# Patient Record
Sex: Male | Born: 1970 | Race: Black or African American | Hispanic: No | Marital: Married | Smoking: Former smoker
Health system: Southern US, Community
[De-identification: ages and names within clinical notes are randomized; demographics above are authoritative.]

## PROBLEM LIST (undated history)

## (undated) HISTORY — PX: HERNIA REPAIR: SHX51

---

## 2003-03-02 ENCOUNTER — Ambulatory Visit (HOSPITAL_BASED_OUTPATIENT_CLINIC_OR_DEPARTMENT_OTHER): Admission: RE | Admit: 2003-03-02 | Discharge: 2003-03-02 | Payer: Self-pay | Admitting: Orthopedic Surgery

## 2005-05-12 ENCOUNTER — Emergency Department (HOSPITAL_COMMUNITY): Admission: EM | Admit: 2005-05-12 | Discharge: 2005-05-12 | Payer: Self-pay | Admitting: Family Medicine

## 2008-05-27 ENCOUNTER — Ambulatory Visit (HOSPITAL_COMMUNITY): Admission: RE | Admit: 2008-05-27 | Discharge: 2008-05-27 | Payer: Self-pay | Admitting: Surgery

## 2009-07-18 IMAGING — CR DG CHEST 2V
3 series · 3 of 3 positions shown · non-contrast
Comparison: None

CLINICAL DATA: preadmission radiograph.

CHEST - 2 VIEW

[view not recorded (1 of 3)]
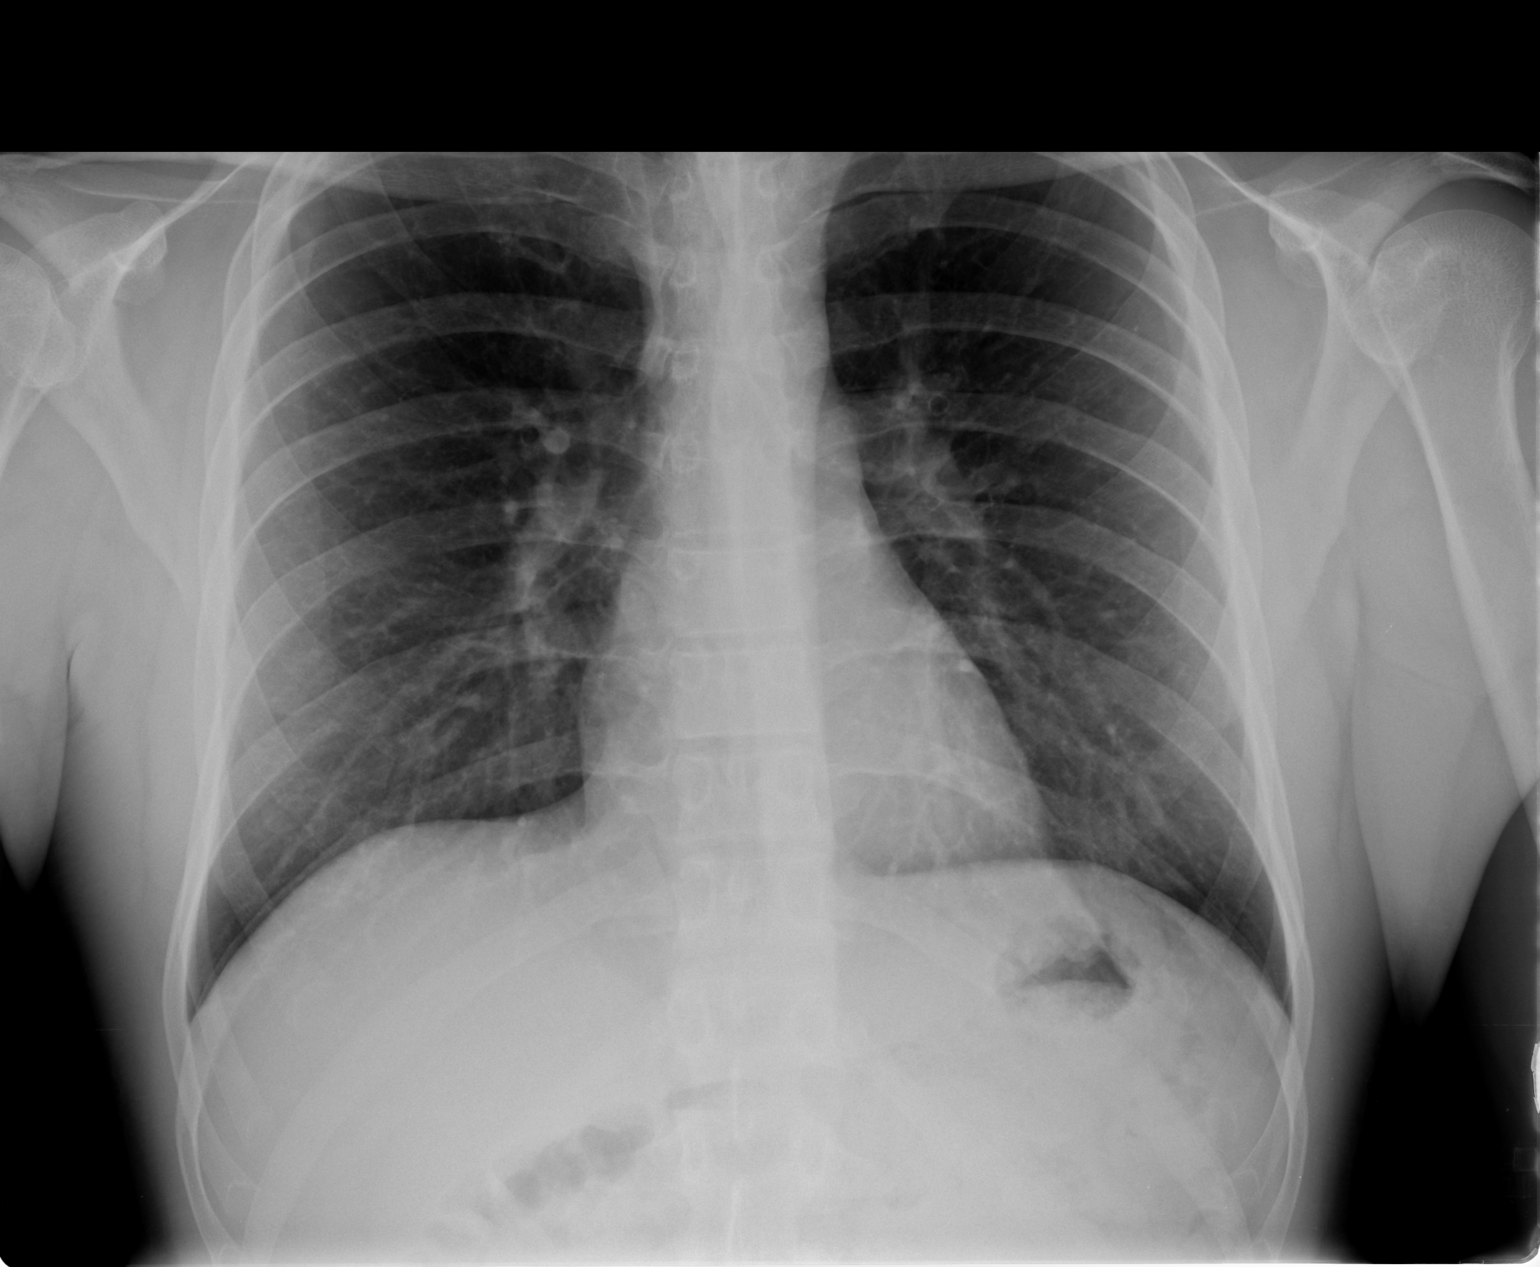

[view not recorded (2 of 3)]
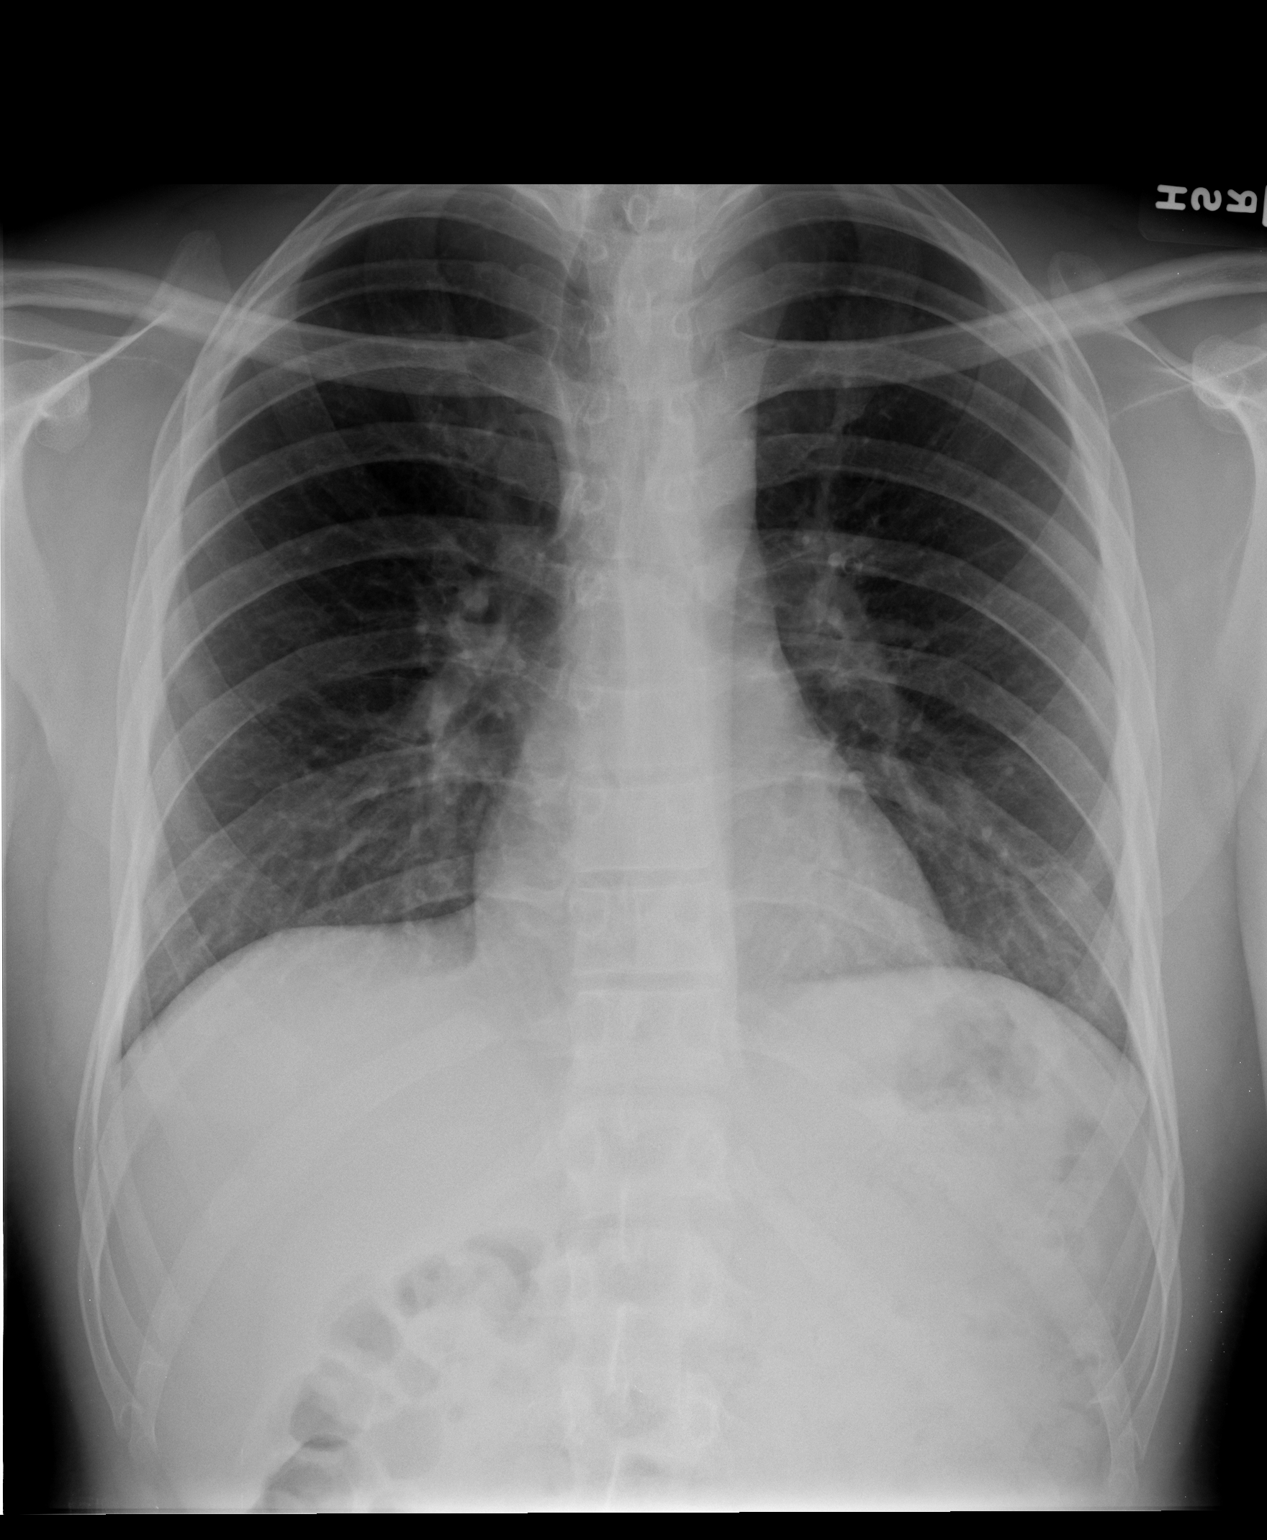

[view not recorded (3 of 3)]
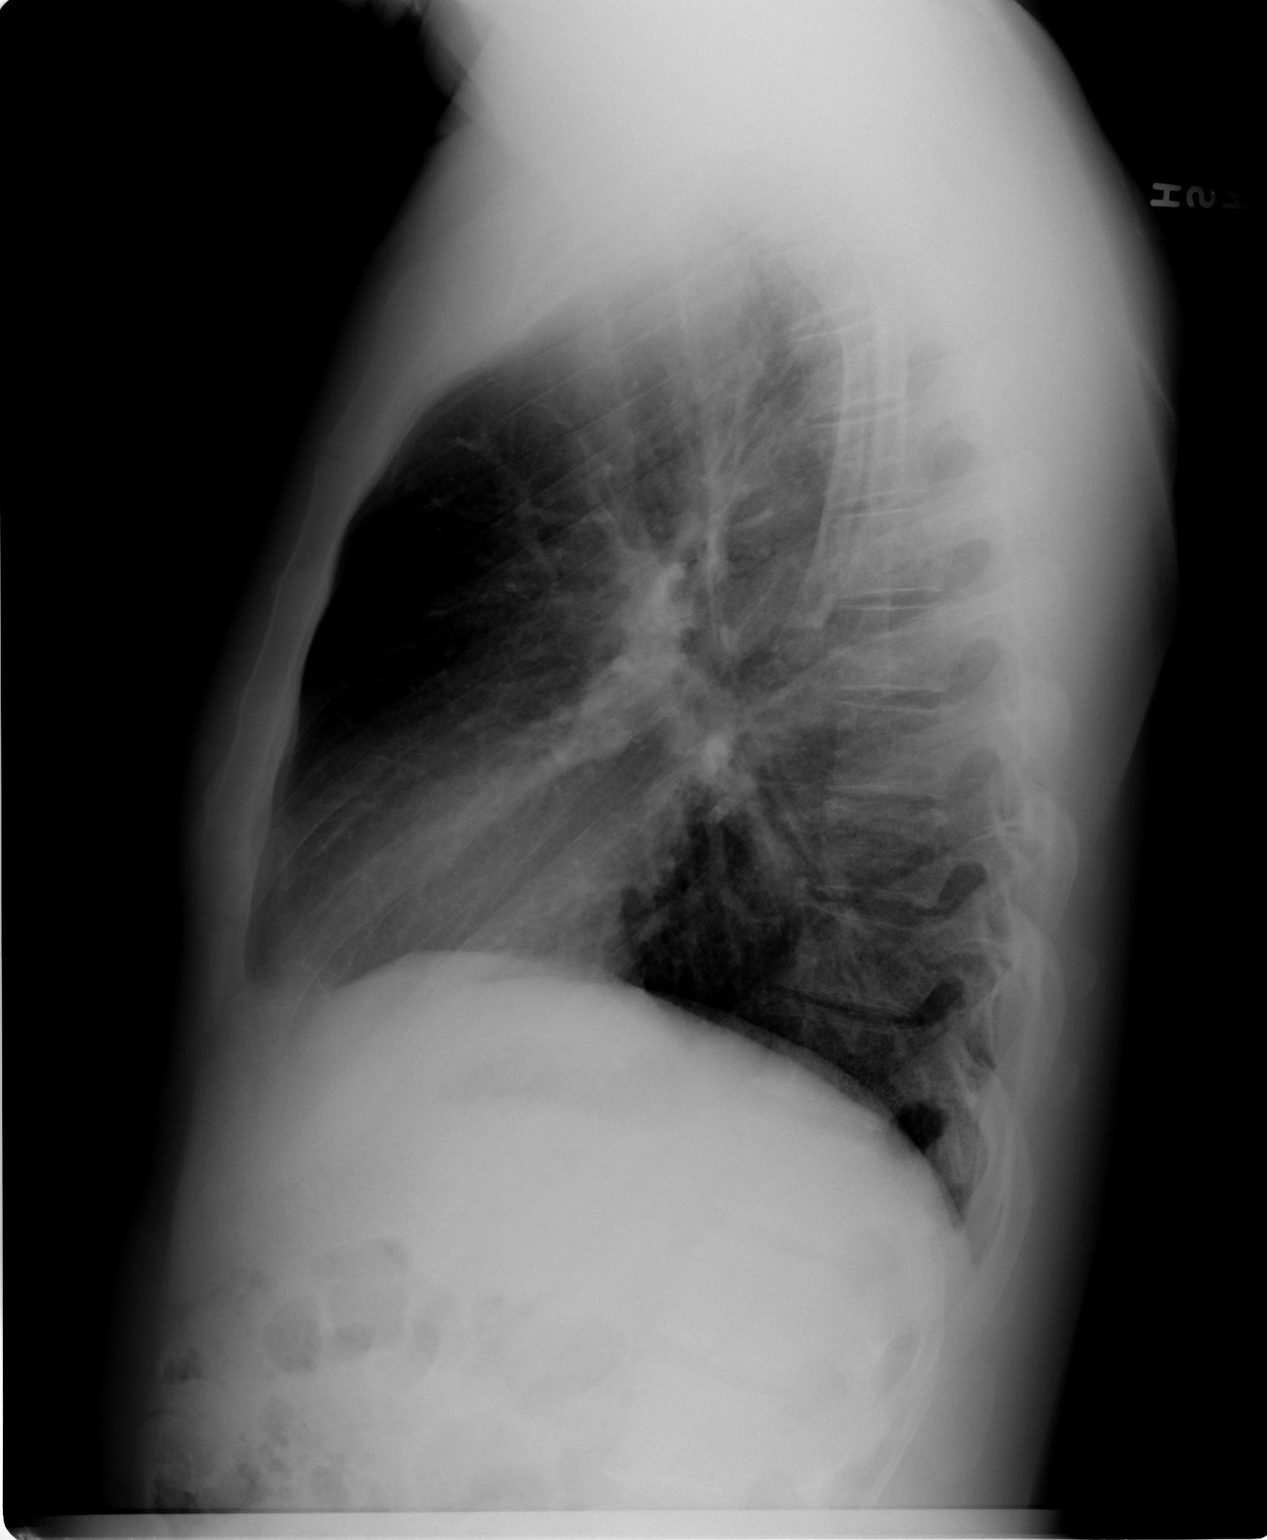

[3 of 3 positions shown; findings below may reference images not displayed]

FINDINGS: Heart size is normal.

There is no pleural effusion or pulmonary edema

No airspace densities identified.
IMPRESSION: 1.  No active cardiopulmonary disease.

## 2010-08-02 LAB — CBC
Platelets: 236 10*3/uL (ref 150–400)
WBC: 3.9 10*3/uL — ABNORMAL LOW (ref 4.0–10.5)

## 2010-08-02 LAB — DIFFERENTIAL
Lymphocytes Relative: 33 % (ref 12–46)
Lymphs Abs: 1.3 10*3/uL (ref 0.7–4.0)
Neutrophils Relative %: 58 % (ref 43–77)

## 2010-08-30 NOTE — Op Note (Signed)
Devin Woodard, Devin Woodard              ACCOUNT NO.:  [id number]   MEDICAL RECORD NO.:  [id number]          PATIENT TYPE:  AMB   LOCATION:  SDS                          FACILITY:  [facility]   PHYSICIAN:  [name], M.D.DATE OF BIRTH:  June 10, 1970   DATE OF PROCEDURE:  05/27/2008  DATE OF DISCHARGE:                               OPERATIVE REPORT   PREOPERATIVE DIAGNOSIS:  Right inguinal hernia.   POSTOPERATIVE DIAGNOSIS:  Right inguinal hernia.   PROCEDURE:  Repair of right inguinal hernia with mesh.   SURGEON:  Maisie Fus A. Cornett, MD   ANESTHESIA:  LMA with 0.25% Sensorcaine local with epinephrine.   ESTIMATED BLOOD LOSS:  20 mL.   SPECIMEN:  None.   DRAINS:  None.   INDICATIONS FOR PROCEDURE:  The patient is a 40 year old male with a  right inguinal hernia.  It is causing symptoms and he wishes to have it  repaired.   DESCRIPTION OF PROCEDURE:  The patient was brought to the operating room  and placed supine.  After induction of LMA anesthesia, the right  inguinal region was prepped and draped in sterile fashion.  Oblique  incision was made in the right inguinal crease.  Dissection was carried  down to subcu tissues.  We infiltrated 0.25% Sensorcaine local  anesthesia.  After ligating the superficial epigastric vessels, we  dissected down  to the aponeurosis of the external oblique muscle.  This  was injected with 0.25% Sensorcaine and it was open in the direction of  its fibers.  We then used my finger to sweep around the cord structures,  encircled them with a 0.5-inch Penrose drain.  There was an indirect sac  and dissected this off the cord structures along with a large lipoma.  The ilioinguinal nerve was divided since it was caught up in the hernia  sac.  After doing this, I reduced all the hernia contents in left lower  back in the preperitoneal space.  We then used a large Prolene Hernia  system placing the air leaflet in the preperitoneal space.  This was  spread  out.  Once this was spread out with my finger, I then laid the  onlay piece of mesh on the floor of the inguinal  canal.  The slit was  cut for cord structures.  Cord was brought out through the cord  structures.  We then secured the mesh to the pubic tubercle with a 0  Vicryl and 0 Vicryl was also used to connect  the tore  piece of the  mesh to the internal oblique.  A #1 Novofils were used to secure the  mesh to the shelving edge of inguinal ligament and the conjoint tendon.  The slit was closed around mesh leaving enough room  to the tip of my  fifth digit. After this was one, the mesh lay quite nicely and is flat.  The iliohypogastric nerve was preserved.  Local anesthesia was  infiltrated under the external oblique.  We then closed the external  oblique with a running 2-0 Vicryl.  A 3-0 Vicryl was used to approximate  Scarpa fascia  and 4-0 Monocryl was used to close the skin in  subcuticular fashion.  Dermabond was applied as dressing.  All final  counts of sponge, needle instruments were found to be correct in this  portion of the case.  The patient was awoken and taken to the recovery  in satisfactory condition.      [name], M.D.  Electronically Signed     TAC/MEDQ  D:  05/27/2008  T:  05/28/2008  Job:  [id number]   cc:   Deirdre Peer. Polite, M.D.

## 2010-09-02 NOTE — Op Note (Signed)
NAME:  Devin Woodard, Devin Woodard                        ACCOUNT NO.:  [id number]   MEDICAL RECORD NO.:  [id number]                   PATIENT TYPE:  AMB   LOCATION:  DSC                                  FACILITY:  [facility]   PHYSICIAN:  Harvie Junior, M.D.                DATE OF BIRTH:  Aug 24, 1970   DATE OF PROCEDURE:  03/02/2003  DATE OF DISCHARGE:                                 OPERATIVE REPORT   PREOPERATIVE DIAGNOSIS:  Right Achilles tendon rupture.   POSTOPERATIVE DIAGNOSIS:  Right Achilles tendon rupture.   PROCEDURE:  Repair of right Achilles tendon.   SURGEON:  Harvie Junior, M.D.   ASSISTANT:  Marshia Ly, P.A.   ANESTHESIA:  General.   INDICATIONS FOR PROCEDURE:  The patient is a 40 year old male with a long  history of playing basketball. Having made a move he felt a pop in the back  of his leg. He turned around and no one was there. He ultimately had  difficulty  moving the foot. He was seen by Dr. Althea Charon at [facility] who evaluated him with an Achilles tendon  rupture and we  were consulted at that point. We talked about treatment  options including operative versus nonoperative treatment and ultimately he  elected to undergo operative intervention. He was taken to the operating  room for Achilles tendon repair.   DESCRIPTION OF PROCEDURE:  The patient was taken to the operating room and  after an adequate level of  anesthesia was obtained with general  endotracheal anesthesia, the patient was placed prone on the operating  table. The right leg was then prepped and draped in the usual sterile  fashion. Following Esmarch exsanguination of the extremity the blood  pressure tourniquet was inflated to 300 mmHg.   Following  this an incision was made just  lateral  to the Achilles tendon.  Subcutaneous dissection was taken down to the level of the paratenon. The  paratenon was identified and opened. The Achilles tendon both proximally  and  distally were both identified. A #2 fiberwire suture  was used in a  running locking Krackow type stitch in both the proximal  and distal piece  and then these were tied. Excellent stability was achieved and the Achilles  could be brought down into neutral station. The tension was found to be  excellent with the repair.   At this point the wound was copiously irrigated. Initially a 4-0 nylon which  was used for covering the sutures with strands of tendon. Once this was  undertaken attention was turned the paratenon closure which was done with a  running 3-0 Vicryl suture. Excellent closure of the paratenon was  undertaken. The skin was then closed with 3-0 Vicryl and a running 4-0 nylon  suture. A sterile compressive dressing was applied at this point with the  foot in plantar flexion at 30 degrees.  The patient was then taken to the recovery room. He was noted to be in  satisfactory condition. Estimated blood loss for the procedure at this point  was none.                                               Harvie Junior, M.D.    Ranae Plumber  D:  03/02/2003  T:  03/02/2003  Job:  [id number]

## 2013-05-23 ENCOUNTER — Emergency Department (INDEPENDENT_AMBULATORY_CARE_PROVIDER_SITE_OTHER): Payer: Self-pay

## 2013-05-23 ENCOUNTER — Emergency Department (INDEPENDENT_AMBULATORY_CARE_PROVIDER_SITE_OTHER)
Admission: EM | Admit: 2013-05-23 | Discharge: 2013-05-23 | Disposition: A | Discharge status: Home / Self Care | Payer: Self-pay | Source: Home / Self Care | Attending: Family Medicine | Admitting: Family Medicine

## 2013-05-23 ENCOUNTER — Encounter (HOSPITAL_COMMUNITY): Payer: Self-pay | Admitting: Emergency Medicine

## 2013-05-23 DIAGNOSIS — M25519 Pain in unspecified shoulder: Secondary | ICD-10-CM

## 2013-05-23 DIAGNOSIS — M25511 Pain in right shoulder: Secondary | ICD-10-CM

## 2013-05-23 MED ORDER — MELOXICAM 7.5 MG PO TABS: 7.5000 mg | ORAL_TABLET | Freq: Every day | ORAL | Status: DC

## 2013-05-23 NOTE — ED Notes (Signed)
Pt  Reports  Symptoms  Of       r  Shoulder  Pain  Which  She  He  Reports  He  Has  Had  For  Sen  Months      He  denys  Any recent injury           rom is   Present  But  painfull

## 2013-05-23 NOTE — Discharge Instructions (Signed)
Arthralgia °Your caregiver has diagnosed you as suffering from an arthralgia. Arthralgia means there is pain in a joint. This can come from many reasons including: °· Bruising the joint which causes soreness (inflammation) in the joint. °· Wear and tear on the joints which occur as we grow older (osteoarthritis). °· Overusing the joint. °· Various forms of arthritis. °· Infections of the joint. °Regardless of the cause of pain in your joint, most of these different pains respond to anti-inflammatory drugs and rest. The exception to this is when a joint is infected, and these cases are treated with antibiotics, if it is a bacterial infection. °HOME CARE INSTRUCTIONS  °· Rest the injured area for as long as directed by your caregiver. Then slowly start using the joint as directed by your caregiver and as the pain allows. Crutches as directed may be useful if the ankles, knees or hips are involved. If the knee was splinted or casted, continue use and care as directed. If an stretchy or elastic wrapping bandage has been applied today, it should be removed and re-applied every 3 to 4 hours. It should not be applied tightly, but firmly enough to keep swelling down. Watch toes and feet for swelling, bluish discoloration, coldness, numbness or excessive pain. If any of these problems (symptoms) occur, remove the ace bandage and re-apply more loosely. If these symptoms persist, contact your caregiver or return to this location. °· For the first 24 hours, keep the injured extremity elevated on pillows while lying down. °· Apply ice for 15-20 minutes to the sore joint every couple hours while awake for the first half day. Then 03-04 times per day for the first 48 hours. Put the ice in a plastic bag and place a towel between the bag of ice and your skin. °· Wear any splinting, casting, elastic bandage applications, or slings as instructed. °· Only take over-the-counter or prescription medicines for pain, discomfort, or fever as  directed by your caregiver. Do not use aspirin immediately after the injury unless instructed by your physician. Aspirin can cause increased bleeding and bruising of the tissues. °· If you were given crutches, continue to use them as instructed and do not resume weight bearing on the sore joint until instructed. °Persistent pain and inability to use the sore joint as directed for more than 2 to 3 days are warning signs indicating that you should see a caregiver for a follow-up visit as soon as possible. Initially, a hairline fracture (break in bone) may not be evident on X-rays. Persistent pain and swelling indicate that further evaluation, non-weight bearing or use of the joint (use of crutches or slings as instructed), or further X-rays are indicated. X-rays may sometimes not show a small fracture until a week or 10 days later. Make a follow-up appointment with your own caregiver or one to whom we have referred you. A radiologist (specialist in reading X-rays) may read your X-rays. Make sure you know how you are to obtain your X-ray results. Do not assume everything is normal if you do not hear from us. °SEEK MEDICAL CARE IF: °Bruising, swelling, or pain increases. °SEEK IMMEDIATE MEDICAL CARE IF:  °· Your fingers or toes are numb or blue. °· The pain is not responding to medications and continues to stay the same or get worse. °· The pain in your joint becomes severe. °· You develop a fever over 102° F (38.9° C). °· It becomes impossible to move or use the joint. °MAKE SURE YOU:  °·   Understand these instructions.  Will watch your condition.  Will get help right away if you are not doing well or get worse. Document Released: 04/03/2005 Document Revised: 06/26/2011 Document Reviewed: 11/20/2007 [facility] Patient Information 2014 [place], [place]. Adhesive Capsulitis Sometimes the shoulder becomes stiff and is painful to move. Some people say it feels as if the shoulder is frozen in place. Because of this, the  condition is called "frozen shoulder." Its medical name is adhesive capsulitis.  The shoulder joint is made up of strong connective tissue that attaches the ball of the humerus to the shallow shoulder socket. This strong connective tissue is called the joint capsule. This tissue can become stiff and swollen. That is when adhesive capsulitis sets in. CAUSES  It is not always clear just what the cause adhesive capsulitis. Possibilities include:  Injury to the shoulder joint.  Strain. This is a repetitive injury brought about by overuse.  Lack of use. Perhaps your arm or hand was otherwise injured. It might have been in a sling for awhile. Or perhaps you were not using it to avoid pain.  Referred pain. This is a sort of trick the body plays. You feel pain in the shoulder. But, the pain actually comes from an injury somewhere else in the body.  Long-standing health problems. Several diseases can cause adhesive capsulitis. They include diabetes, heart disease, stroke, thyroid problems, rheumatoid arthritis and lung disease.  Being a women older than 40. Anyone can develop adhesive capsulitis but it is most common in women in this age group. SYMPTOMS   Pain.  It occurs when the arm is moved.  Parts of the shoulder might hurt if they are touched.  Pain is worse at night or when resting.  Soreness. It might not be strong enough to be called pain. But, the shoulder aches.  The shoulder does not move freely.  Muscle spasms.  Trouble sleeping because of shoulder ache or pain. DIAGNOSIS  To decide if you have adhesive capsulitis, your healthcare provider will probably:  Ask about symptoms you have noticed.  Ask about your history of joint pain and anything that might have caused the pain.  Ask about your overall health.  Use hands to feel your shoulder and neck.  Ask you to move your shoulder in specific directions. This may indicate the origin of the pain.  Order imaging tests;  pictures of the shoulder. They help pinpoint the source of the problem. An X-ray might be used. For more detail, an MRI is often used. An MRI details the tendons, muscles and ligaments as well as the joint. TREATMENT  Adhesive capsulitis can be treated several ways. Most treatments can be done in a clinic or in your healthcare provider's office. Be sure to discuss the different options with your caregiver. They include:  Physical therapy. You will work on specific exercises to get your shoulder moving again. The exercises usually involve stretching. A physical therapist (a caregiver with special training) can show you what to do and what not to do. The exercises will need to be done daily.  Medication.  Over-the-counter medicines may relieve pain and inflammation (the body's way of reacting to injury or infection).  Corticosteroids. These are stronger drugs to reduce pain and inflammation. They are given by injection (shots) into the shoulder joint. Frequent treatment is not recommended.  Muscle relaxants. Medication may be prescribed to ease muscle spasms.  Treatment of underlying conditions. This means treating another condition that is causing your shoulder problem. This  might be a rotator cuff (tendon) problem  Shoulder manipulation. The shoulder will be moved by your healthcare provider. You would be under general anesthesia (given a drug that puts you to sleep). You would not feel anything. Sometimes the joint will be injected with salt water (saline) at high pressure to break down internal scarring in the joint capsule.  Surgery. This is rarely needed. It may be suggested in advanced cases after all other treatment has failed. PROGNOSIS  In time, most people recover from adhesive capsulitis. Sometimes, however, the pain goes away but full movement of the shoulder does not return.  HOME CARE INSTRUCTIONS   Take any pain medications recommended by your healthcare provider. Follow the  directions carefully.  If you have physical therapy, follow through with the therapist's suggestions. Be sure you understand the exercises you will be doing. You should understand:  How often the exercises should be done.  How many times each exercise should be repeated.  How long they should be done.  What other activities you should do, or not do.  That you should warm up before doing any exercise. Just 5 to 10 minutes will help. Small, gentle movements should get your shoulder ready for more.  Avoid high-demand exercise that involves your shoulder such as throwing. This type of exercise can make pain worse.  Consider using cold packs. Cold may ease swelling and pain. Ask your healthcare provider if a cold pack might help you. If so, get directions on how and when to use them. SEEK MEDICAL CARE IF:   You have any questions about your medications.  Your pain continues to increase. Document Released: 01/29/2009 Document Revised: 06/26/2011 Document Reviewed: 01/29/2009 [facility] Patient Information 2014 [place], [place].

## 2013-05-23 NOTE — ED Provider Notes (Signed)
CSN: [id number]     Arrival date & time 05/23/13  0913 History   First MD Initiated Contact with Patient 05/23/13 1037     Chief Complaint  Patient presents with  . Shoulder Pain   (Consider location/radiation/quality/duration/timing/severity/associated sxs/prior Treatment) Patient is a 43 y.o. male presenting with shoulder pain. The history is provided by the patient. No language interpreter was used.  Shoulder Pain This is a new problem. The problem occurs constantly. The problem has been rapidly worsening. Pertinent negatives include no shortness of breath. Nothing aggravates the symptoms. Nothing relieves the symptoms. He has tried nothing for the symptoms. The treatment provided no relief.  Pt complains of pain in his shoulder sine a fall last June.    History reviewed. No pertinent past medical history. History reviewed. No pertinent past surgical history. History reviewed. No pertinent family history. History  Substance Use Topics  . Smoking status: Not on file  . Smokeless tobacco: Not on file  . Alcohol Use: No    Review of Systems  Respiratory: Negative for shortness of breath.   Musculoskeletal: Positive for myalgias. Negative for joint swelling.  All other systems reviewed and are negative.    Allergies  Review of patient's allergies indicates no known allergies.  Home Medications  No current outpatient prescriptions on file. BP 136/80  Pulse 74  Temp(Src) 98.3 F (36.8 C) (Oral)  Resp 16  SpO2 100% Physical Exam  Nursing note and vitals reviewed. Constitutional: He appears well-developed and well-nourished.  HENT:  Head: Normocephalic and atraumatic.  Right Ear: External ear normal.  Left Ear: External ear normal.  Eyes: Conjunctivae and EOM are normal. Pupils are equal, round, and reactive to light.  Neck: Normal range of motion.  Cardiovascular: Normal rate and normal heart sounds.   Pulmonary/Chest: Effort normal.  Musculoskeletal: He exhibits  tenderness.  Limited range of motion,  nv and ns intact  Neurological: He is alert.  Skin: Skin is warm.  Psychiatric: He has a normal mood and affect.    ED Course  Procedures (including critical care time) Labs Review Labs Reviewed - No data to display Imaging Review Dg Shoulder Right  05/23/2013   CLINICAL DATA:  Recent traumatic injury with pain  EXAM: RIGHT SHOULDER - 2+ VIEW  COMPARISON:  None.  FINDINGS: There is no evidence of fracture or dislocation. There is no evidence of arthropathy or other focal bone abnormality. Soft tissues are unremarkable.  IMPRESSION: No acute abnormality noted.   Electronically Signed   By: Alcide CleverMark  Lukens M.D.   On: 05/23/2013 11:35      MDM   1. Right shoulder pain    Pt referred to Dr. Charlann Boxerlin for evaluation.   Rx for meloxicam   Elson AreasLeslie K Azad Calame, PA-C 05/23/13 1244

## 2013-05-28 NOTE — ED Provider Notes (Signed)
Medical screening examination/treatment/procedure(s) were performed by resident physician or non-physician practitioner and as supervising physician I was immediately available for consultation/collaboration.   Owen Pagnotta DOUGLAS MD.   Que Meneely D Garlen Reinig, MD 05/28/13 1803 

## 2014-07-09 ENCOUNTER — Encounter: Payer: Self-pay | Admitting: *Deleted

## 2014-07-09 ENCOUNTER — Emergency Department
Admission: EM | Admit: 2014-07-09 | Discharge: 2014-07-09 | Disposition: A | Discharge status: Home / Self Care | Payer: Self-pay | Source: Home / Self Care | Attending: Emergency Medicine | Admitting: Emergency Medicine

## 2014-07-09 DIAGNOSIS — R05 Cough: Secondary | ICD-10-CM

## 2014-07-09 DIAGNOSIS — N480 Leukoplakia of penis: Secondary | ICD-10-CM

## 2014-07-09 DIAGNOSIS — J029 Acute pharyngitis, unspecified: Secondary | ICD-10-CM

## 2014-07-09 DIAGNOSIS — R059 Cough, unspecified: Secondary | ICD-10-CM

## 2014-07-09 DIAGNOSIS — N489 Disorder of penis, unspecified: Secondary | ICD-10-CM

## 2014-07-09 LAB — POCT RAPID STREP A (OFFICE): RAPID STREP A SCREEN: NEGATIVE

## 2014-07-09 MED ORDER — ACYCLOVIR 200 MG PO CAPS: 200.0000 mg | ORAL_CAPSULE | Freq: Every day | ORAL | Status: AC

## 2014-07-09 NOTE — ED Provider Notes (Signed)
CSN: [id number]     Arrival date & time 07/09/14  1439 History   First MD Initiated Contact with Patient 07/09/14 1511     Chief Complaint  Patient presents with  . Penile lesion   . Sore Throat   (Consider location/radiation/quality/duration/timing/severity/associated sxs/prior Treatment) Patient is a 44 y.o. male presenting with pharyngitis. The history is provided by the patient.  Sore Throat This is a new problem. The current episode started 2 days ago. The problem occurs constantly. The problem has been gradually worsening. Pertinent negatives include no chest pain. Nothing aggravates the symptoms. Nothing relieves the symptoms. He has tried nothing for the symptoms.  Pt also complains of a lesion on the shaft of his penis.  Pt noticed after oral sex.  Pt also concerned about diabetes, cholesterol and wants a check up and blood work.  Pt reports possible std exposure.  History reviewed. No pertinent past medical history. Past Surgical History  Procedure Laterality Date  . Hernia repair     History reviewed. No pertinent family history. History  Substance Use Topics  . Smoking status: Former Games developermoker  . Smokeless tobacco: Never Used  . Alcohol Use: No    Review of Systems  Cardiovascular: Negative for chest pain.  Genitourinary: Positive for penile pain. Negative for urgency, discharge, penile swelling and testicular pain.  Skin: Positive for wound.  All other systems reviewed and are negative.   Allergies  Review of patient's allergies indicates no known allergies.  Home Medications   Prior to Admission medications   Medication Sig Start Date End Date Taking? Authorizing Provider  acyclovir (ZOVIRAX) 200 MG capsule Take 1 capsule (200 mg total) by mouth 5 (five) times daily. 07/09/14   Elson AreasLeslie K Rylei Codispoti, PA-C   BP 125/78 mmHg  Pulse 66  Temp(Src) 98.1 F (36.7 C) (Oral)  Resp 14  Ht 5\' 8"  (1.727 m)  Wt 188 lb (85.276 kg)  BMI 28.59 kg/m2  SpO2 99% Physical Exam   Constitutional: He appears well-developed.  HENT:  Head: Normocephalic and atraumatic.  Eyes: Pupils are equal, round, and reactive to light.  Neck: Normal range of motion.  Cardiovascular: Normal rate and normal heart sounds.   Pulmonary/Chest: Effort normal.  Abdominal: Soft.  Genitourinary:  Small abrasion looking area dorsal shaft of peins,  (looks like a small abrasion,  2 small (tiny area's that could be pimple/blister)   Musculoskeletal: Normal range of motion.  Neurological: He is alert.  Skin: Skin is warm.  Psychiatric: He has a normal mood and affect.  Nursing note and vitals reviewed.   ED Course  Procedures (including critical care time) Labs Review Labs Reviewed  HERPES SIMPLEX VIRUS CULTURE  POCT RAPID STREP A (OFFICE)    Imaging Review No results found.   MDM   1. Penile lesion   2. Acute pharyngitis, unspecified pharyngitis type   3. Cough    I will send herpes culture Pt given rx for acyclovir  Pt advised symptomatic care for throat. Pt given primary care referral for check up    Elson AreasLeslie K Anona Giovannini, PA-C 07/09/14 1737

## 2014-07-09 NOTE — ED Notes (Signed)
Pt c/o sore throat x 2 days. Also c/o penile bumps x 1 week that has turned into a lesion. Denies discharge or pain. He inquired about routine blood work as he does not have a PCP. Advised to talk to provider as we are not a PCP office. Encouraged him to establish with a PCP.

## 2014-07-09 NOTE — Discharge Instructions (Signed)
Pharyngitis Pharyngitis is redness, pain, and swelling (inflammation) of your pharynx.  CAUSES  Pharyngitis is usually caused by infection. Most of the time, these infections are from viruses (viral) and are part of a cold. However, sometimes pharyngitis is caused by bacteria (bacterial). Pharyngitis can also be caused by allergies. Viral pharyngitis may be spread from person to person by coughing, sneezing, and personal items or utensils (cups, forks, spoons, toothbrushes). Bacterial pharyngitis may be spread from person to person by more intimate contact, such as kissing.  SIGNS AND SYMPTOMS  Symptoms of pharyngitis include:   Sore throat.   Tiredness (fatigue).   Low-grade fever.   Headache.  Joint pain and muscle aches.  Skin rashes.  Swollen lymph nodes.  Plaque-like film on throat or tonsils (often seen with bacterial pharyngitis). DIAGNOSIS  Your health care provider will ask you questions about your illness and your symptoms. Your medical history, along with a physical exam, is often all that is needed to diagnose pharyngitis. Sometimes, a rapid strep test is done. Other lab tests may also be done, depending on the suspected cause.  TREATMENT  Viral pharyngitis will usually get better in 3-4 days without the use of medicine. Bacterial pharyngitis is treated with medicines that kill germs (antibiotics).  HOME CARE INSTRUCTIONS   Drink enough water and fluids to keep your urine clear or pale yellow.   Only take over-the-counter or prescription medicines as directed by your health care provider:   If you are prescribed antibiotics, make sure you finish them even if you start to feel better.   Do not take aspirin.   Get lots of rest.   Gargle with 8 oz of salt water ( tsp of salt per 1 qt of water) as often as every 1-2 hours to soothe your throat.   Throat lozenges (if you are not at risk for choking) or sprays may be used to soothe your throat. SEEK MEDICAL  CARE IF:   You have large, tender lumps in your neck.  You have a rash.  You cough up green, yellow-brown, or bloody spit. SEEK IMMEDIATE MEDICAL CARE IF:   Your neck becomes stiff.  You drool or are unable to swallow liquids.  You vomit or are unable to keep medicines or liquids down.  You have severe pain that does not go away with the use of recommended medicines.  You have trouble breathing (not caused by a stuffy nose). MAKE SURE YOU:   Understand these instructions.  Will watch your condition.  Will get help right away if you are not doing well or get worse. Document Released: 04/03/2005 Document Revised: 01/22/2013 Document Reviewed: 12/09/2012 [employer] Patient Information 2015 [employer]. This information is not intended to replace advice given to you by your health care provider. Make sure you discuss any questions you have with your health care provider.  Genital Herpes Genital herpes is a sexually transmitted disease. This means that it is a disease passed by having sex with an infected person. There is no cure for genital herpes. The time between attacks can be months to years. The virus may live in a person but produce no problems (symptoms). This infection can be passed to a baby as it travels down the birth canal (vagina). In a newborn, this can cause central nervous system damage, eye damage, or even death. The virus that causes genital herpes is usually HSV-2 virus. The virus that causes oral herpes is usually HSV-1. The diagnosis (learning what is wrong) is   made through culture results. SYMPTOMS  Usually symptoms of pain and itching begin a few days to a week after contact. It first appears as small blisters that progress to small painful ulcers which then scab over and heal after several days. It affects the outer genitalia, birth canal, cervix, penis, anal area, buttocks, and thighs. HOME CARE INSTRUCTIONS   Keep ulcerated areas dry and clean.  Take  medications as directed. Antiviral medications can speed up healing. They will not prevent recurrences or cure this infection. These medications can also be taken for suppression if there are frequent recurrences.  While the infection is active, it is contagious. Avoid all sexual contact during active infections.  Condoms may help prevent spread of the herpes virus.  Practice safe sex.  Wash your hands thoroughly after touching the genital area.  Avoid touching your eyes after touching your genital area.  Inform your caregiver if you have had genital herpes and become pregnant. It is your responsibility to insure a safe outcome for your baby in this pregnancy.  Only take over-the-counter or prescription medicines for pain, discomfort, or fever as directed by your caregiver. SEEK MEDICAL CARE IF:   You have a recurrence of this infection.  You do not respond to medications and are not improving.  You have new sources of pain or discharge which have changed from the original infection.  You have an oral temperature above 102 F (38.9 C).  You develop abdominal pain.  You develop eye pain or signs of eye infection. Document Released: 03/31/2000 Document Revised: 06/26/2011 Document Reviewed: 04/21/2009 [employer] Patient Information 2015 [employer]. This information is not intended to replace advice given to you by your health care provider. Make sure you discuss any questions you have with your health care provider.   

## 2014-07-15 ENCOUNTER — Ambulatory Visit: Payer: Self-pay | Admitting: Family Medicine

## 2014-07-15 LAB — HERPES SIMPLEX VIRUS CULTURE: Organism ID, Bacteria: DETECTED

## 2014-07-15 IMAGING — CR DG SHOULDER 2+V*R*
3 series · 3 of 3 positions shown · non-contrast
Comparison: None.

CLINICAL DATA: Recent traumatic injury with pain

EXAM:
RIGHT SHOULDER - 2+ VIEW

[view not recorded (1 of 3)]
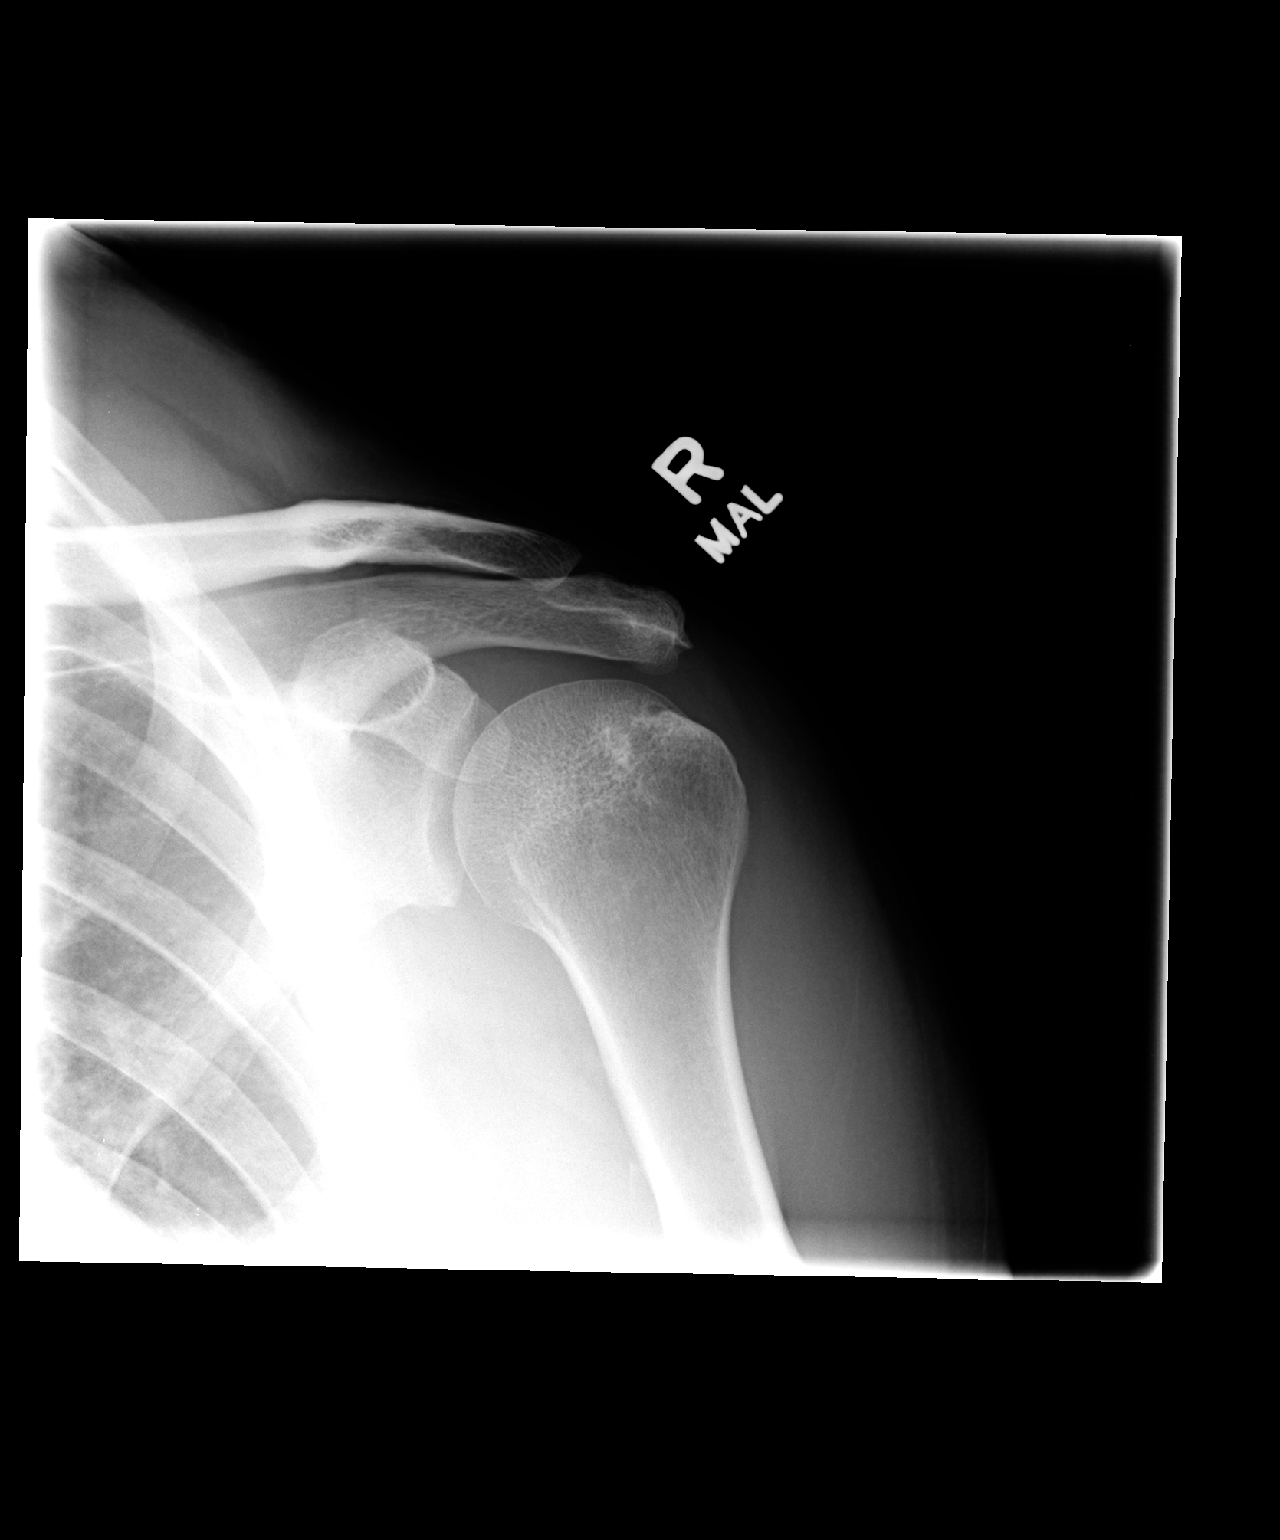

[view not recorded (2 of 3)]
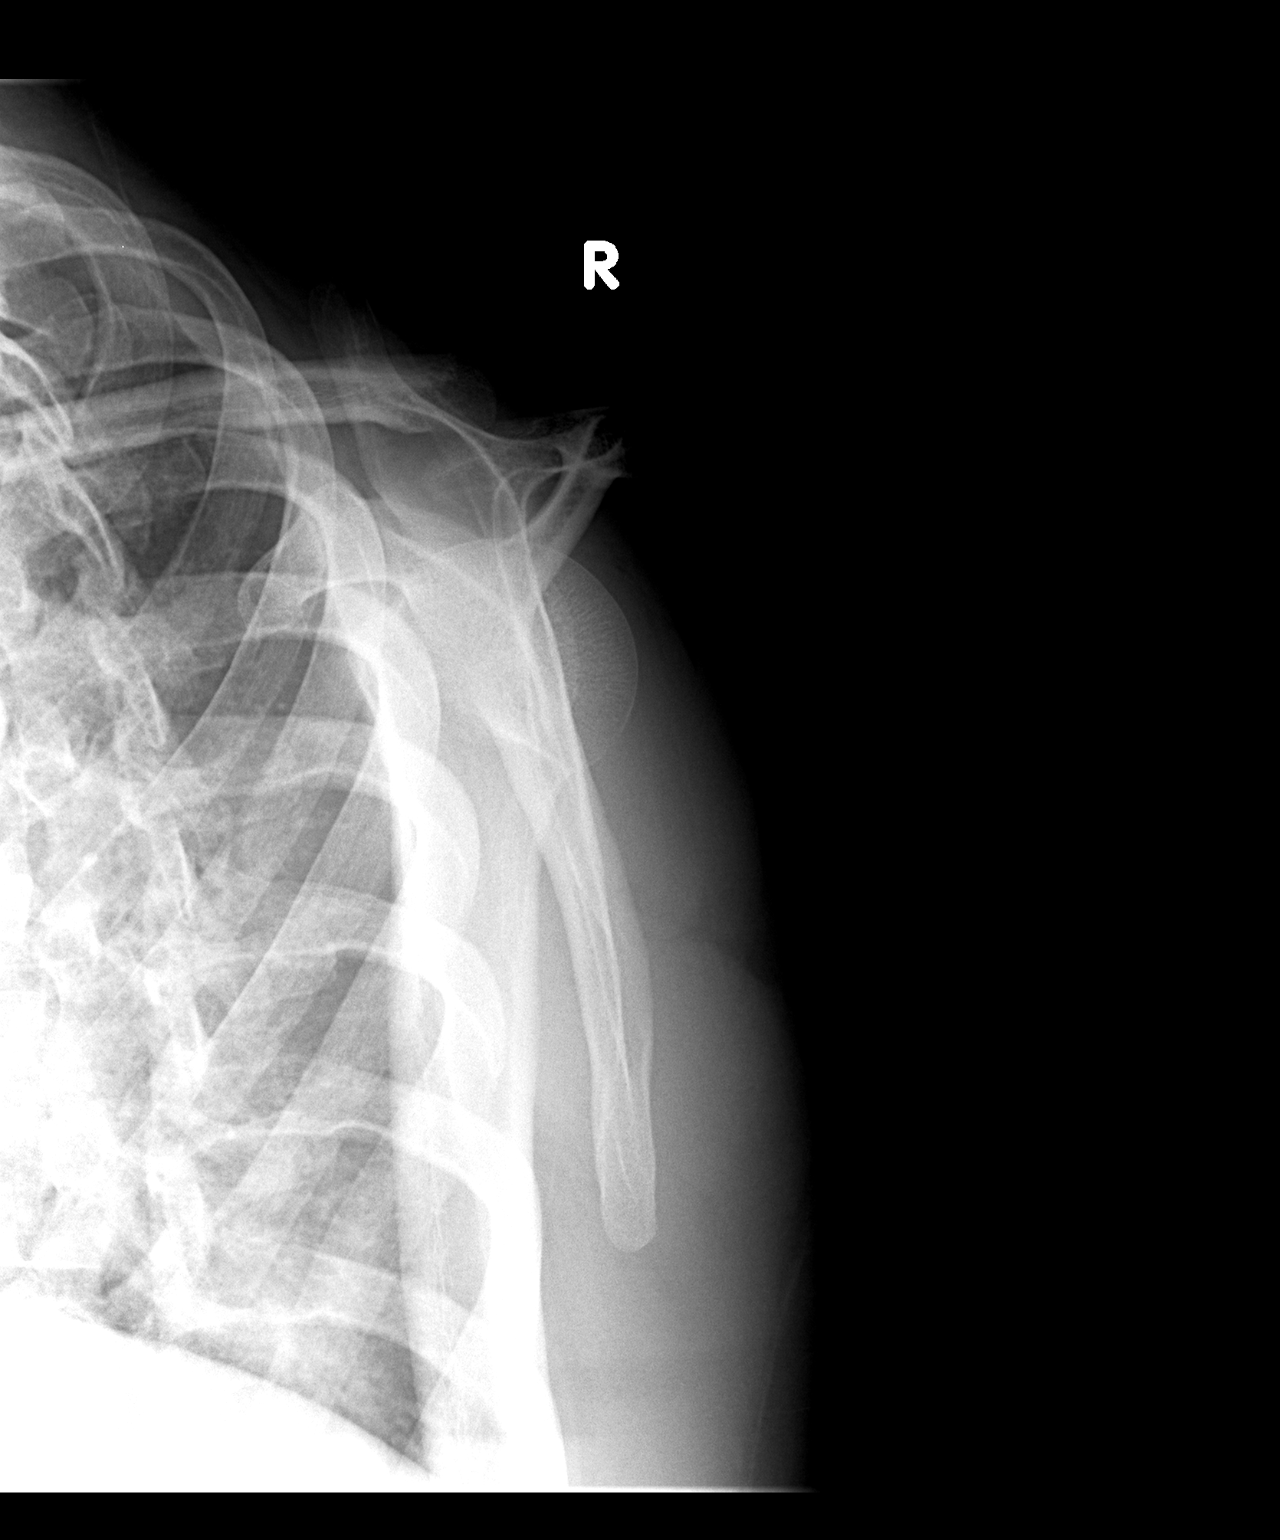

[view not recorded (3 of 3)]
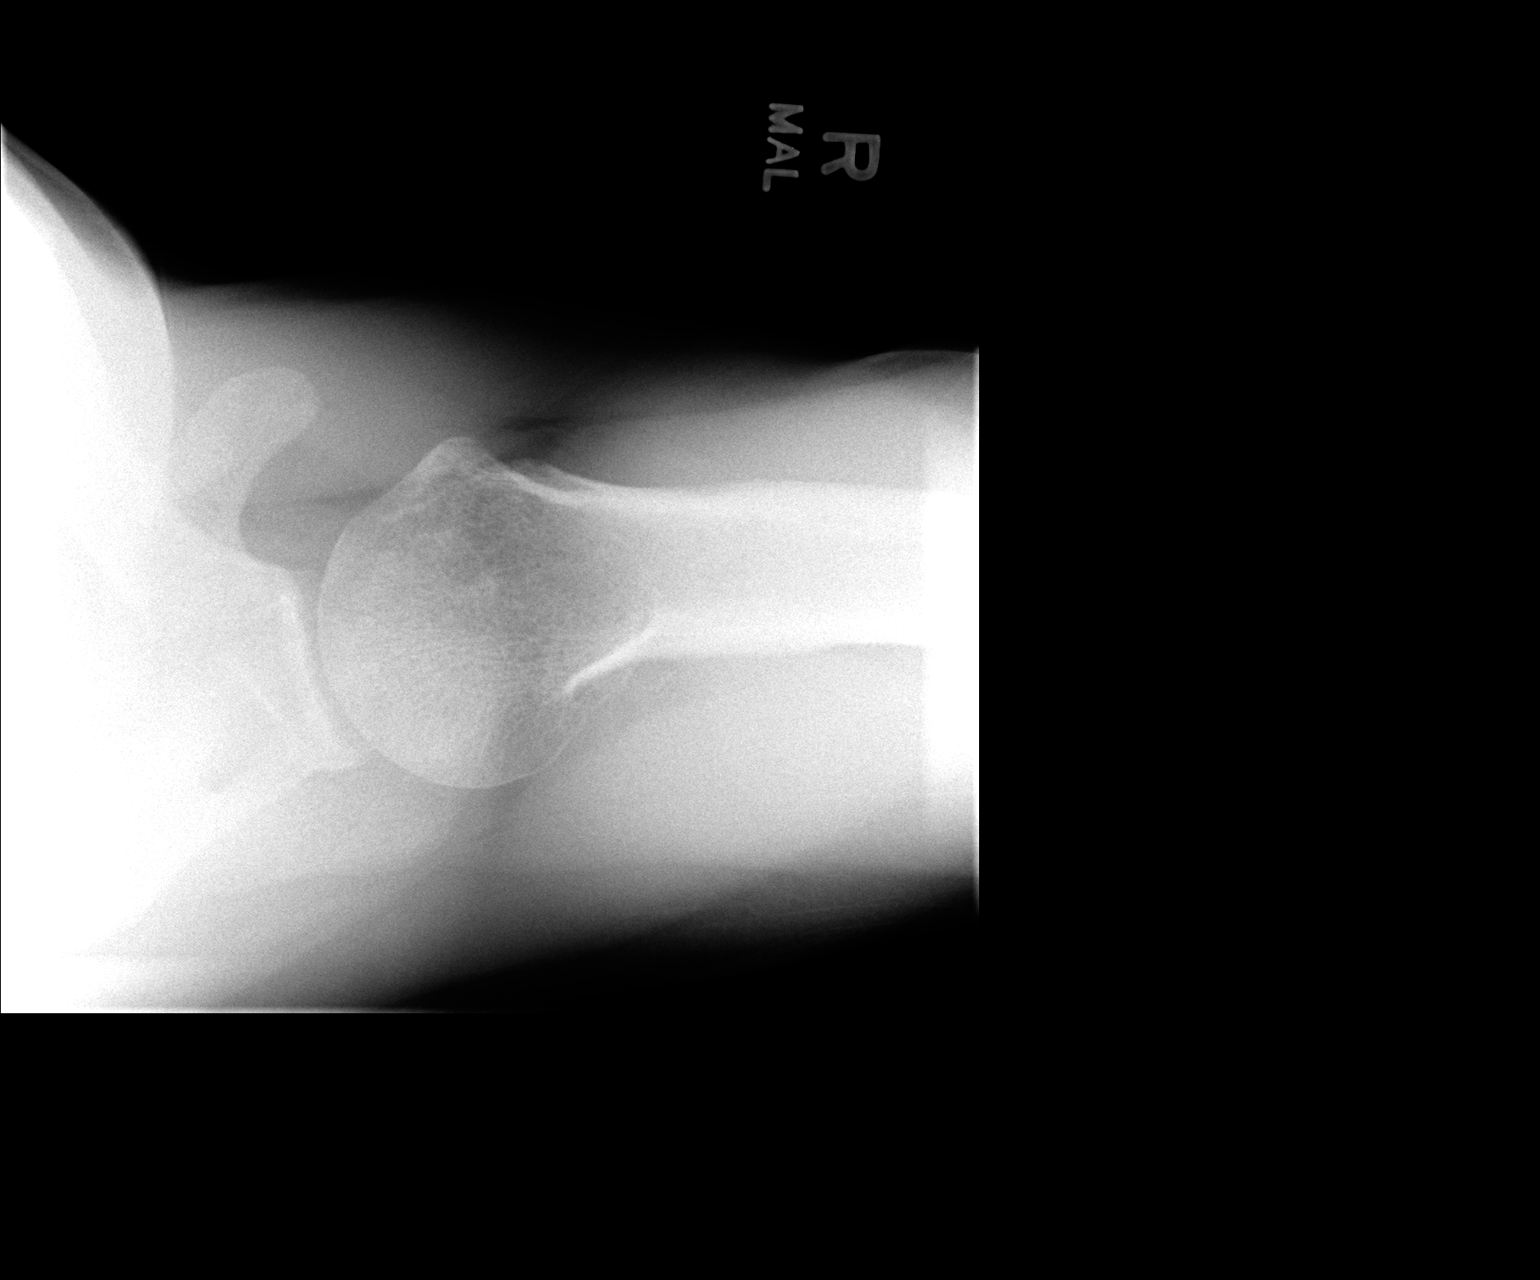

[3 of 3 positions shown; findings below may reference images not displayed]

FINDINGS: There is no evidence of fracture or dislocation. There is no
evidence of arthropathy or other focal bone abnormality. Soft
tissues are unremarkable.
IMPRESSION: No acute abnormality noted.

## 2014-07-16 ENCOUNTER — Telehealth: Payer: Self-pay | Admitting: *Deleted

## 2014-07-20 ENCOUNTER — Ambulatory Visit: Payer: Self-pay | Admitting: Family Medicine

## 2014-08-10 ENCOUNTER — Ambulatory Visit: Payer: Self-pay | Admitting: Family Medicine

## 2014-09-01 ENCOUNTER — Ambulatory Visit: Payer: Self-pay | Admitting: Family Medicine

## 2014-10-05 ENCOUNTER — Ambulatory Visit: Payer: Self-pay | Admitting: Family Medicine
